# Patient Record
Sex: Male | Born: 1965 | Race: White | Hispanic: No | Marital: Married | Smoking: Never smoker
Health system: Southern US, Community
[De-identification: ages and names within clinical notes are randomized; demographics above are authoritative.]

## PROBLEM LIST (undated history)

## (undated) DIAGNOSIS — M1990 Unspecified osteoarthritis, unspecified site: Secondary | ICD-10-CM

## (undated) DIAGNOSIS — G709 Myoneural disorder, unspecified: Secondary | ICD-10-CM

## (undated) HISTORY — PX: CARDIAC CATHETERIZATION: SHX172

---

## 2005-04-03 ENCOUNTER — Ambulatory Visit: Payer: Self-pay | Admitting: Cardiology

## 2010-08-28 HISTORY — PX: ULNAR NERVE TRANSPOSITION: SHX2595

## 2016-12-15 ENCOUNTER — Other Ambulatory Visit: Payer: Self-pay | Admitting: Nurse Practitioner

## 2016-12-15 DIAGNOSIS — G243 Spasmodic torticollis: Secondary | ICD-10-CM

## 2017-01-02 ENCOUNTER — Ambulatory Visit
Admission: RE | Admit: 2017-01-02 | Discharge: 2017-01-02 | Disposition: A | Discharge status: Home / Self Care | Payer: BLUE CROSS/BLUE SHIELD | Source: Ambulatory Visit | Attending: Nurse Practitioner | Admitting: Nurse Practitioner

## 2017-01-02 DIAGNOSIS — G248 Other dystonia: Secondary | ICD-10-CM | POA: Diagnosis not present

## 2017-01-02 DIAGNOSIS — M50323 Other cervical disc degeneration at C6-C7 level: Secondary | ICD-10-CM | POA: Insufficient documentation

## 2017-01-02 DIAGNOSIS — M4802 Spinal stenosis, cervical region: Secondary | ICD-10-CM | POA: Diagnosis not present

## 2017-01-02 DIAGNOSIS — G243 Spasmodic torticollis: Secondary | ICD-10-CM

## 2017-03-07 ENCOUNTER — Ambulatory Visit: Payer: BLUE CROSS/BLUE SHIELD | Admitting: Anesthesiology

## 2017-03-07 ENCOUNTER — Ambulatory Visit
Admission: RE | Admit: 2017-03-07 | Discharge: 2017-03-07 | Disposition: A | Discharge status: Home / Self Care | Payer: BLUE CROSS/BLUE SHIELD | Source: Ambulatory Visit | Attending: Surgery | Admitting: Surgery

## 2017-03-07 ENCOUNTER — Encounter
Admission: RE | Disposition: A | Discharge status: Home / Self Care | Payer: Self-pay | Source: Ambulatory Visit | Attending: Surgery

## 2017-03-07 DIAGNOSIS — Z800 Family history of malignant neoplasm of digestive organs: Secondary | ICD-10-CM | POA: Insufficient documentation

## 2017-03-07 DIAGNOSIS — M1990 Unspecified osteoarthritis, unspecified site: Secondary | ICD-10-CM | POA: Insufficient documentation

## 2017-03-07 DIAGNOSIS — Z9889 Other specified postprocedural states: Secondary | ICD-10-CM | POA: Insufficient documentation

## 2017-03-07 DIAGNOSIS — M109 Gout, unspecified: Secondary | ICD-10-CM | POA: Insufficient documentation

## 2017-03-07 DIAGNOSIS — Z8249 Family history of ischemic heart disease and other diseases of the circulatory system: Secondary | ICD-10-CM | POA: Diagnosis not present

## 2017-03-07 DIAGNOSIS — Z86010 Personal history of colonic polyps: Secondary | ICD-10-CM | POA: Diagnosis not present

## 2017-03-07 DIAGNOSIS — Z79899 Other long term (current) drug therapy: Secondary | ICD-10-CM | POA: Insufficient documentation

## 2017-03-07 DIAGNOSIS — G5622 Lesion of ulnar nerve, left upper limb: Secondary | ICD-10-CM | POA: Diagnosis not present

## 2017-03-07 DIAGNOSIS — Z833 Family history of diabetes mellitus: Secondary | ICD-10-CM | POA: Insufficient documentation

## 2017-03-07 DIAGNOSIS — Z791 Long term (current) use of non-steroidal anti-inflammatories (NSAID): Secondary | ICD-10-CM | POA: Insufficient documentation

## 2017-03-07 HISTORY — PX: ULNAR NERVE TRANSPOSITION: SHX2595

## 2017-03-07 HISTORY — DX: Unspecified osteoarthritis, unspecified site: M19.90

## 2017-03-07 HISTORY — DX: Myoneural disorder, unspecified: G70.9

## 2017-03-07 SURGERY — ULNAR NERVE DECOMPRESSION/TRANSPOSITION
Anesthesia: Regional | Laterality: Left | Wound class: Clean

## 2017-03-07 MED ORDER — LACTATED RINGERS IV SOLN
INTRAVENOUS | Status: DC
Start: 2017-03-07 — End: 2017-03-07
  Administered 2017-03-07: 13:00:00 via INTRAVENOUS

## 2017-03-07 MED ORDER — ROPIVACAINE HCL 5 MG/ML IJ SOLN
INTRAMUSCULAR | Status: DC | PRN
  Administered 2017-03-07: 35 mL

## 2017-03-07 MED ORDER — ROPIVACAINE HCL 5 MG/ML IJ SOLN
INTRAMUSCULAR | Status: DC | PRN
  Administered 2017-03-07: 10 mL

## 2017-03-07 MED ORDER — ONDANSETRON HCL 4 MG/2ML IJ SOLN
INTRAMUSCULAR | Status: DC | PRN
  Administered 2017-03-07: 4 mg via INTRAVENOUS

## 2017-03-07 MED ORDER — PROPOFOL 10 MG/ML IV BOLUS
INTRAVENOUS | Status: DC | PRN
  Administered 2017-03-07: 200 mg via INTRAVENOUS

## 2017-03-07 MED ORDER — HYDROCODONE-ACETAMINOPHEN 5-325 MG PO TABS: 1.0000 | ORAL_TABLET | Freq: Four times a day (QID) | ORAL | 0 refills | Status: AC | PRN

## 2017-03-07 MED ORDER — DEXTROSE 5 % IV SOLN
2000.0000 mg | Freq: Once | INTRAVENOUS | Status: AC
  Administered 2017-03-07: 2000 mg via INTRAVENOUS

## 2017-03-07 MED ORDER — LACTATED RINGERS IV SOLN
INTRAVENOUS | Status: DC
  Administered 2017-03-07: 13:00:00 via INTRAVENOUS

## 2017-03-07 MED ORDER — MIDAZOLAM HCL 2 MG/2ML IJ SOLN
INTRAMUSCULAR | Status: DC | PRN
  Administered 2017-03-07: 2 mg via INTRAVENOUS

## 2017-03-07 MED ORDER — FENTANYL CITRATE (PF) 100 MCG/2ML IJ SOLN
INTRAMUSCULAR | Status: DC | PRN
  Administered 2017-03-07: 50 ug via INTRAVENOUS

## 2017-03-07 MED ORDER — DEXAMETHASONE SODIUM PHOSPHATE 4 MG/ML IJ SOLN
INTRAMUSCULAR | Status: DC | PRN
  Administered 2017-03-07: 4 mg via INTRAVENOUS

## 2017-03-07 MED ORDER — LIDOCAINE HCL (CARDIAC) 20 MG/ML IV SOLN
INTRAVENOUS | Status: DC | PRN
  Administered 2017-03-07: 50 mg via INTRATRACHEAL

## 2017-03-07 SURGICAL SUPPLY — 32 items
ADHESIVE MASTISOL STRL (MISCELLANEOUS) ×2 IMPLANT
BANDAGE ELASTIC 3 LF NS (GAUZE/BANDAGES/DRESSINGS) ×2 IMPLANT
BANDAGE ELASTIC 4 LF NS (GAUZE/BANDAGES/DRESSINGS) ×2 IMPLANT
BNDG COHESIVE 4X5 TAN STRL (GAUZE/BANDAGES/DRESSINGS) ×2 IMPLANT
BNDG ESMARK 4X12 TAN STRL LF (GAUZE/BANDAGES/DRESSINGS) ×2 IMPLANT
CANISTER SUCT 1200ML W/VALVE (MISCELLANEOUS) ×2 IMPLANT
CHLORAPREP W/TINT 26ML (MISCELLANEOUS) ×2 IMPLANT
CORD BIP STRL DISP 12FT (MISCELLANEOUS) ×2 IMPLANT
COVER LIGHT HANDLE UNIVERSAL (MISCELLANEOUS) ×4 IMPLANT
DRAPE U-SHAPE 48X52 POLY STRL (PACKS) ×2 IMPLANT
GAUZE PETRO XEROFOAM 1X8 (MISCELLANEOUS) ×2 IMPLANT
GAUZE SPONGE 4X4 12PLY STRL (GAUZE/BANDAGES/DRESSINGS) ×2 IMPLANT
GLOVE BIO SURGEON STRL SZ7.5 (GLOVE) ×2 IMPLANT
GLOVE INDICATOR 8.0 STRL GRN (GLOVE) ×2 IMPLANT
GOWN STRL REUS W/ TWL LRG LVL3 (GOWN DISPOSABLE) ×1 IMPLANT
GOWN STRL REUS W/ TWL XL LVL3 (GOWN DISPOSABLE) ×1 IMPLANT
GOWN STRL REUS W/TWL LRG LVL3 (GOWN DISPOSABLE) ×1
GOWN STRL REUS W/TWL XL LVL3 (GOWN DISPOSABLE) ×1
KIT ROOM TURNOVER OR (KITS) ×2 IMPLANT
LOOP VESSEL RED MINI 1.3X0.9 (MISCELLANEOUS) ×1 IMPLANT
LOOPS RED MINI 1.3MMX0.9MM (MISCELLANEOUS) ×1
NS IRRIG 500ML POUR BTL (IV SOLUTION) ×2 IMPLANT
PACK EXTREMITY ARMC (MISCELLANEOUS) ×2 IMPLANT
PAD GROUND ADULT SPLIT (MISCELLANEOUS) ×2 IMPLANT
SLING ARM LRG DEEP (SOFTGOODS) ×2 IMPLANT
STOCKINETTE IMPERVIOUS LG (DRAPES) ×2 IMPLANT
STRAP BODY AND KNEE 60X3 (MISCELLANEOUS) ×2 IMPLANT
STRIP CLOSURE SKIN 1/4X4 (GAUZE/BANDAGES/DRESSINGS) ×2 IMPLANT
SUT VIC AB 2-0 CT1 27 (SUTURE) ×1
SUT VIC AB 2-0 CT1 TAPERPNT 27 (SUTURE) ×1 IMPLANT
SUT VIC AB 3-0 SH 27 (SUTURE) ×1
SUT VIC AB 3-0 SH 27X BRD (SUTURE) ×1 IMPLANT

## 2017-03-07 NOTE — Transfer of Care (Signed)
Immediate Anesthesia Transfer of Care Note  Patient: Dustin Ward  Procedure(s) Performed: Procedure(s): ULNAR NERVE DECOMPRESSION/TRANSPOSITION  elbow (Left)  Patient Location: PACU  Anesthesia Type: General, Regional  Level of Consciousness: awake, alert  and patient cooperative  Airway and Oxygen Therapy: Patient Spontanous Breathing and Patient connected to supplemental oxygen  Post-op Assessment: Post-op Vital signs reviewed, Patient's Cardiovascular Status Stable, Respiratory Function Stable, Patent Airway and No signs of Nausea or vomiting  Post-op Vital Signs: Reviewed and stable  Complications: No apparent anesthesia complications

## 2017-03-07 NOTE — Anesthesia Preprocedure Evaluation (Signed)
Anesthesia Evaluation  Patient identified by MRN, date of birth, ID band Patient awake    Reviewed: Allergy & Precautions, NPO status , Patient's Chart, lab work & pertinent test results  History of Anesthesia Complications Negative for: history of anesthetic complications  Airway Mallampati: III  TM Distance: >3 FB Neck ROM: Full    Dental no notable dental hx.    Pulmonary neg pulmonary ROS,    Pulmonary exam normal breath sounds clear to auscultation       Cardiovascular Exercise Tolerance: Good negative cardio ROS Normal cardiovascular exam Rhythm:Regular Rate:Normal     Neuro/Psych negative neurological ROS  negative psych ROS   GI/Hepatic negative GI ROS, Neg liver ROS,   Endo/Other  negative endocrine ROS  Renal/GU negative Renal ROS     Musculoskeletal  (+) Arthritis , Osteoarthritis,    Abdominal   Peds  Hematology negative hematology ROS (+)   Anesthesia Other Findings   Reproductive/Obstetrics                             Anesthesia Physical Anesthesia Plan  ASA: I  Anesthesia Plan: General and Regional   Post-op Pain Management: GA combined w/ Regional for post-op pain   Induction: Intravenous  PONV Risk Score and Plan: 1 and Ondansetron  Airway Management Planned: LMA  Additional Equipment:   Intra-op Plan:   Post-operative Plan: Extubation in OR  Informed Consent: I have reviewed the patients History and Physical, chart, labs and discussed the procedure including the risks, benefits and alternatives for the proposed anesthesia with the patient or authorized representative who has indicated his/her understanding and acceptance.     Plan Discussed with:   Anesthesia Plan Comments: (Plan for supraclavicular nerve block for post-operative pain control.)        Anesthesia Quick Evaluation

## 2017-03-07 NOTE — Anesthesia Postprocedure Evaluation (Signed)
Anesthesia Post Note  Patient: [name]  Procedure(s) Performed: Procedure(s) (LRB): ULNAR NERVE DECOMPRESSION/TRANSPOSITION  elbow (Left)  Patient location during evaluation: PACU Anesthesia Type: Regional Level of consciousness: awake and alert, oriented and patient cooperative Pain management: pain level controlled Vital Signs Assessment: post-procedure vital signs reviewed and stable Respiratory status: spontaneous breathing, nonlabored ventilation and respiratory function stable Cardiovascular status: blood pressure returned to baseline and stable Postop Assessment: adequate PO intake Anesthetic complications: no    Reed BreechAndrea Mally Gavina

## 2017-03-07 NOTE — Op Note (Signed)
03/07/2017  3:44 PM  Patient:   Dustin Ward  Pre-Op Diagnosis:   Left cubital tunnel syndrome.  Post-Op Diagnosis:   Same  Procedure:   Subcutaneous anterior transposition ulnar nerve, left elbow.  Surgeon:   Maryagnes AmosJ. Jeffrey Poggi, MD  Assistant:   None  Anesthesia:   General LMA  Findings:   As above.  Complications:   None  EBL:   5 cc  Fluids:   900 cc crystalloid  TT:   61 minutes at 250 mmHg  Drains:   None  Closure:   Staples  Brief Clinical Note:   The patient is a 51 year old male with a history of pian and paresthesias to the left ring and little fingers. The patient's history and examination were consistent with cubital tunnel syndrome. The patient presents at this time for subcutaneous anterior transposition of the ulnar nerve at the left elbow.  Procedure:   The patient was brought into the operating room and lain in the supine position. After adequate general laryngal mask anesthesia was obtained, the patient's left upper extremity was prepped with ChloraPrep solution before being draped sterilely. Preoperative antibiotics were administered. After performing a timeout to verify the appropriate surgical site, the limb was exsanguinated with an Esmarch and the tourniquet inflated to 250 mmHg. An approximately 7-8 cm curvilinear incision was made along the course of the ulnar nerve posterior to the medial epicondyle. The incision was carried down through the subcutaneous tissues with care taken to avoid the small branches of the medial antebrachial nerve to expose the sheath overlying the cubital tunnel. The ulnar nerve was identified at the proximal end of the tunnel and was dissected free. The nerve was then carefully followed as the roof of the cubital tunnel was released from proximal to distal. Distally, the fascia overlying the pronator muscle was released for several centimeters. The nerve was clearly thickened just proximal to the pronator fascia, indicating the most  likely source of the nerve compression. A vessel loop was passed around the nerve and used to provide gentle traction on the nerve while circumferential dissection was carried out under loupe magnification using bipolar electrocautery and tenotomy scissors.   Once the nerve was fully mobilized, the anterior tissues were elevated as a flap just superficial to the fascia overlying the flexor wad and a pocket created to accept the nerve. Care was taken to be sure that there was no undue tension along the nerve either proximally or distally. The nerve was carefully retracted while several 2-0 Vicryl interrupted sutures were placed to reapproximate the flap to the medial epicondylar soft tissues, thereby creating a "sling" for the ulnar nerve. The cubital tunnel itself was reapproximated using several 2-0 Vicryl interrupted sutures in order to prevent the nerve from falling back into the cubital tunnel.   The wound was copiously irrigated with sterile saline solution before the subcutaneous tissues were closed using 2-0 Vicryl interrupted sutures. The subcuticular layer was re-approximated using 3-0 Vicryl inverted subcuticular interrupted sutures before Benzoin and steri-strips were applied to the skin. A total of 10 cc of 0.5% plain Sensorcaine was injected in and around the incision to help with postoperative analgesia. A sterile bulky dressing was applied to the arm before the patient was placed into a sling. The patient was then awakened, extubated, and returned to the recovery room in satisfactory condition after tolerating the procedure well.

## 2017-03-07 NOTE — H&P (Signed)
Paper H&P to be scanned into permanent record. H&P reviewed and patient re-examined. No changes. 

## 2017-03-07 NOTE — Anesthesia Procedure Notes (Signed)
Anesthesia Regional Block: Supraclavicular block   Pre-Anesthetic Checklist: ,, timeout performed, Correct Patient, Correct Site, Correct Laterality, Correct Procedure, Correct Position, site marked, Risks and benefits discussed,  Surgical consent,  Pre-op evaluation,  At surgeon's request and post-op pain management  Laterality: Left  Prep: chloraprep       Needles:  Injection technique: Single-shot  Needle Type: Stimiplex     Needle Length: 5cm  Needle Gauge: 21     Additional Needles:   Procedures: ultrasound guided,,,,,,,,  Narrative:  Start time: 03/07/2017 1:13 PM End time: 03/07/2017 1:25 PM Injection made incrementally with aspirations every 5 mL.  Performed by: Personally  Anesthesiologist: Dustin Ward, Dustin Ward  Additional Notes: Procedure well-tolerated without any apparent complications.

## 2017-03-07 NOTE — Progress Notes (Signed)
Assisted Dr. Ronni RumbleMazzoni with left, ultrasound guided, supraclavicular block. Side rails up, monitors on throughout procedure. See vital signs in flow sheet. Tolerated Procedure well.

## 2017-03-07 NOTE — Anesthesia Procedure Notes (Signed)
Procedure Name: LMA Insertion Date/Time: 03/07/2017 2:14 PM Performed by: Andee PolesBUSH, Dustin Ward Pre-anesthesia Checklist: Patient identified, Emergency Drugs available, Suction available, Timeout performed and Patient being monitored Patient Re-evaluated:Patient Re-evaluated prior to induction Oxygen Delivery Method: Circle system utilized Preoxygenation: Pre-oxygenation with 100% oxygen Induction Type: IV induction LMA: LMA inserted LMA Size: 4.0 Number of attempts: 1 Placement Confirmation: positive ETCO2 and breath sounds checked- equal and bilateral Tube secured with: Tape

## 2017-03-07 NOTE — Discharge Instructions (Signed)
General Anesthesia, Adult, Care After These instructions provide you with information about caring for yourself after your procedure. Your health care provider may also give you more specific instructions. Your treatment has been planned according to current medical practices, but problems sometimes occur. Call your health care provider if you have any problems or questions after your procedure. What can I expect after the procedure? After the procedure, it is common to have:  Vomiting.  A sore throat.  Mental slowness.  It is common to feel:  Nauseous.  Cold or shivery.  Sleepy.  Tired.  Sore or achy, even in parts of your body where you did not have surgery.  Follow these instructions at home: For at least 24 hours after the procedure:  Do not: ? Participate in activities where you could fall or become injured. ? Drive. ? Use heavy machinery. ? Drink alcohol. ? Take sleeping pills or medicines that cause drowsiness. ? Make important decisions or sign legal documents. ? Take care of children on your own.  Rest. Eating and drinking  If you vomit, drink water, juice, or soup when you can drink without vomiting.  Drink enough fluid to keep your urine clear or pale yellow.  Make sure you have little or no nausea before eating solid foods.  Follow the diet recommended by your health care provider. General instructions  Have a responsible adult stay with you until you are awake and alert.  Return to your normal activities as told by your health care provider. Ask your health care provider what activities are safe for you.  Take over-the-counter and prescription medicines only as told by your health care provider.  If you smoke, do not smoke without supervision.  Keep all follow-up visits as told by your health care provider. This is important. Contact a health care provider if:  You continue to have nausea or vomiting at home, and medicines are not helpful.  You  cannot drink fluids or start eating again.  You cannot urinate after 8-12 hours.  You develop a skin rash.  You have fever.  You have increasing redness at the site of your procedure. Get help right away if:  You have difficulty breathing.  You have chest pain.  You have unexpected bleeding.  You feel that you are having a life-threatening or urgent problem. This information is not intended to replace advice given to you by your health care provider. Make sure you discuss any questions you have with your health care provider. Document Released: 11/20/2000 Document Revised: 01/17/2016 Document Reviewed: 07/29/2015 Elsevier Interactive Patient Education  2018 [employer]  Keep dressing dry and intact. Keep hand elevated above heart level. May shower after dressing removed on postop day 4 (Sunday). Cover staples with Band-Aids after drying off. Apply ice to affected area frequently. Resume meloxicam daily. Take pain medication as prescribed or extra strength Tylenol when needed.  Return for follow-up in 10-14 days or as scheduled.

## 2017-03-08 ENCOUNTER — Encounter: Payer: Self-pay | Admitting: Surgery

## 2018-04-12 IMAGING — MR MR CERVICAL SPINE W/O CM
5 series · 33 of 48 positions shown · non-contrast
Comparison: None.

CLINICAL DATA: Cervical dystonia

EXAM:
MRI CERVICAL SPINE WITHOUT CONTRAST
TECHNIQUE: Multiplanar, multisequence MR imaging of the cervical spine was
performed. No intravenous contrast was administered.

[Series 2: T2 · sagittal · 3.0mm · 0.56mm/px · 6 of 13 slices shown (1 of 2)]
[im 1/13]
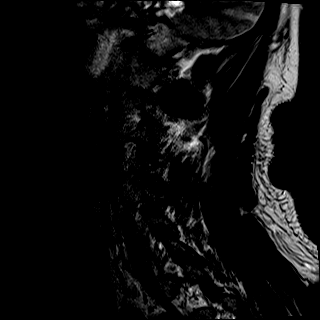
[im 3/13]
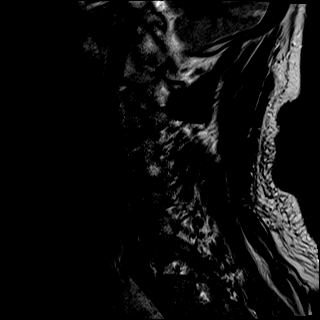
[im 5/13]
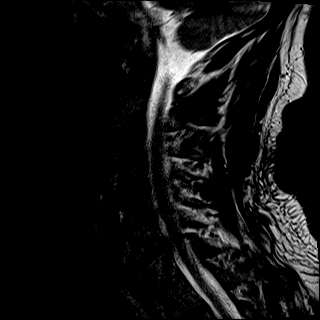
[im 8/13]
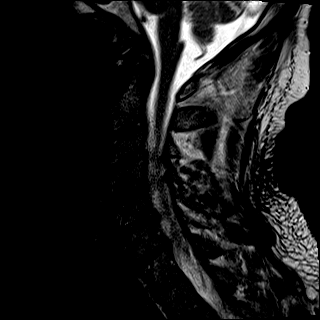
[im 10/13]
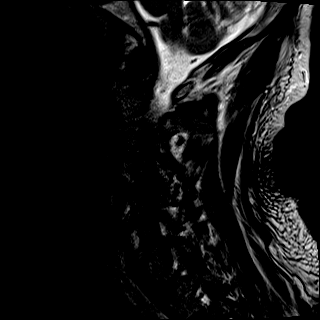
[im 13/13]
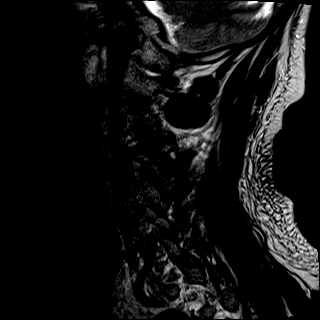

[Series 3: T1 · sagittal · 3.0mm · 0.70mm/px · 7 of 13 slices shown]
[im 1/13]
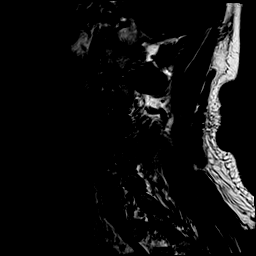
[im 3/13]
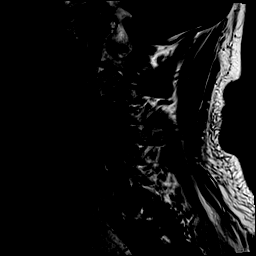
[im 5/13]
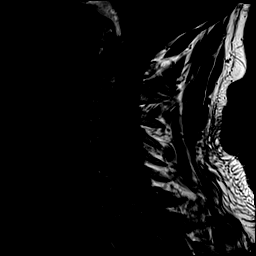
[im 7/13]
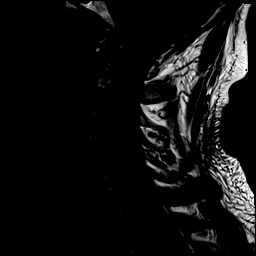
[im 9/13]
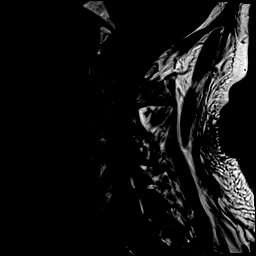
[im 11/13]
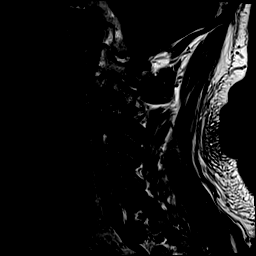
[im 13/13]
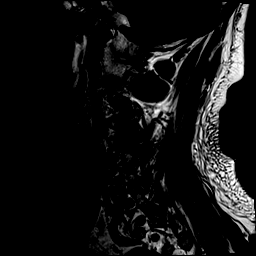

[Series 4: STIR · sagittal · 3.0mm · 0.35mm/px · 7 of 13 slices shown]
[im 1/13]
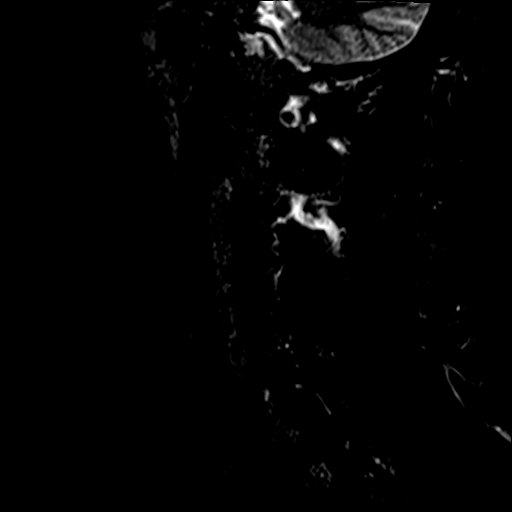
[im 3/13]
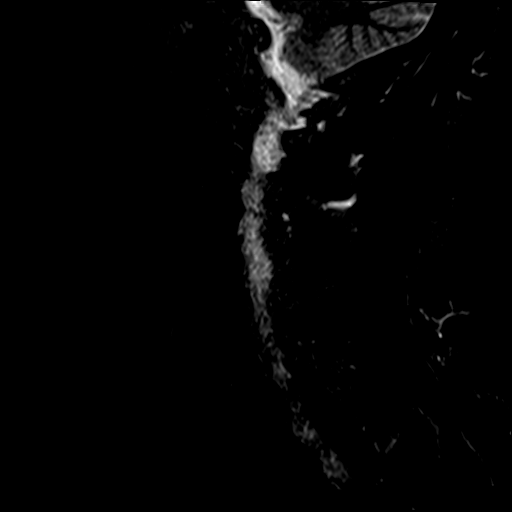
[im 5/13]
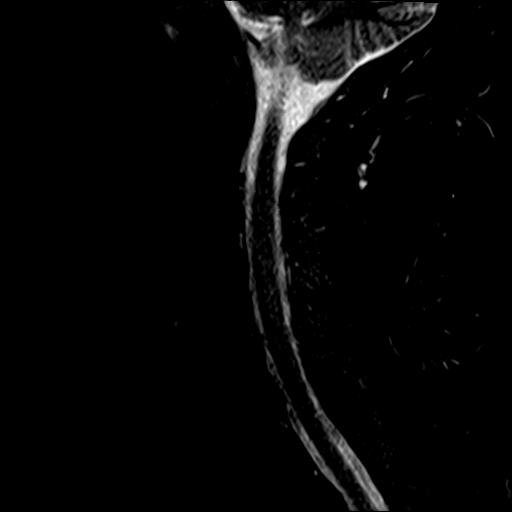
[im 7/13]
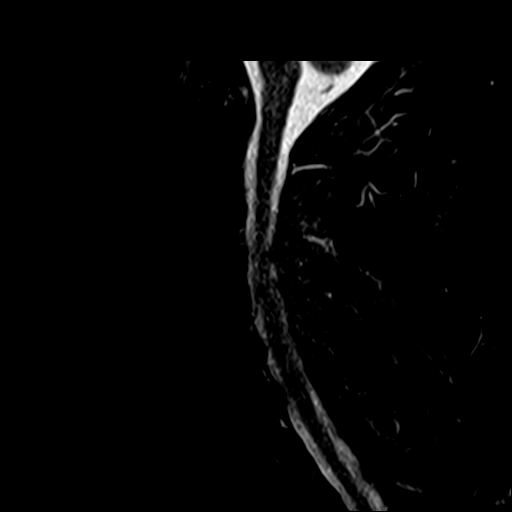
[im 9/13]
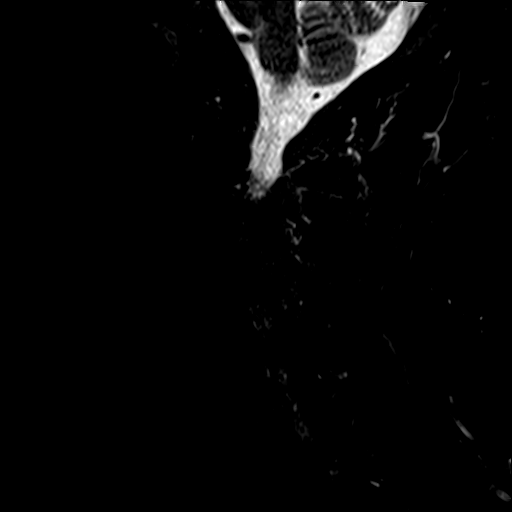
[im 11/13]
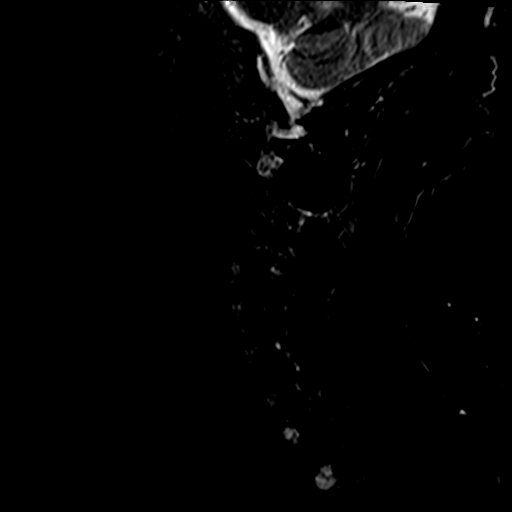
[im 13/13]
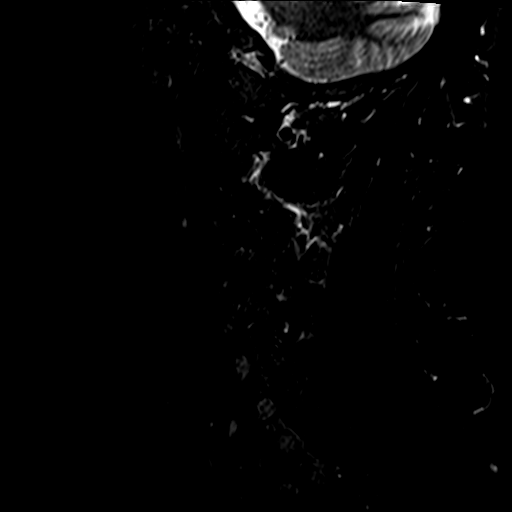

[Series 6: mpgr ax · axial · 3.0mm · 0.35mm/px · z∈[-98,-48]mm · 5 of 28 slices shown]
[im 1/28]
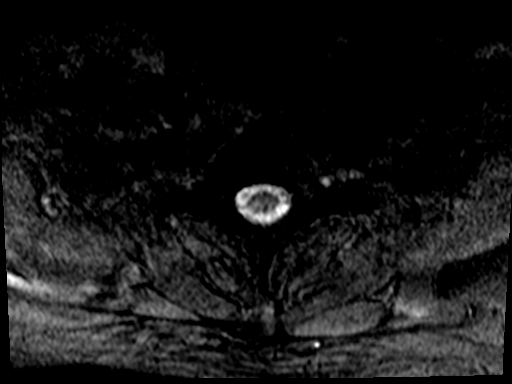
[im 5/28]
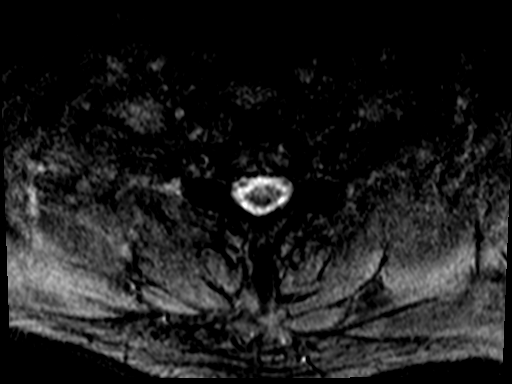
[im 9/28]
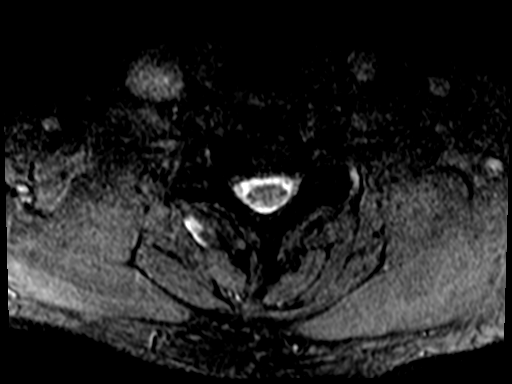
[im 13/28]
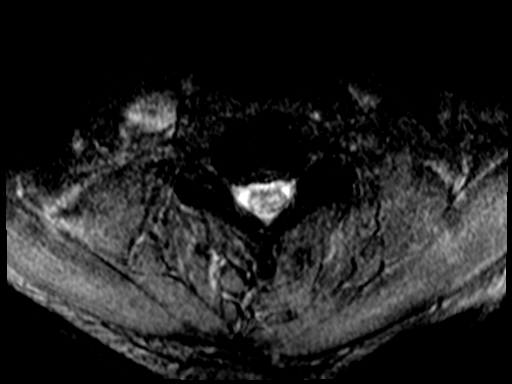
[im 15/28]
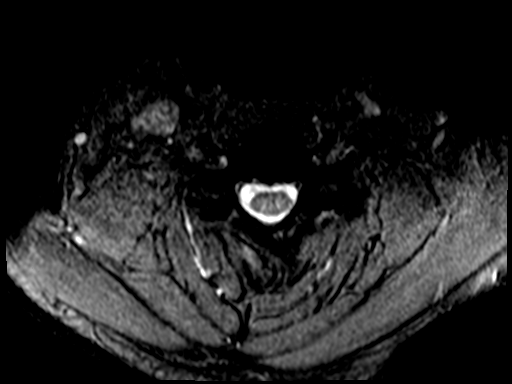

[Series 100: T2 · axial · 3.0mm · 0.62mm/px · z∈[-111,-14]mm · 8 of 28 slices shown (2 of 2)]
[im 1/28]
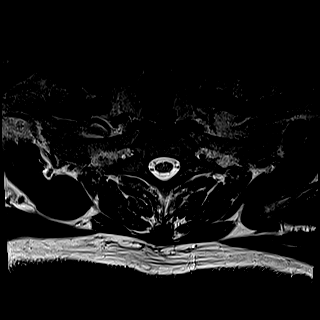
[im 5/28]
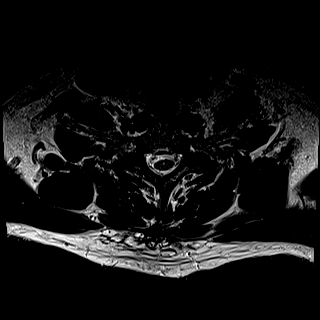
[im 9/28]
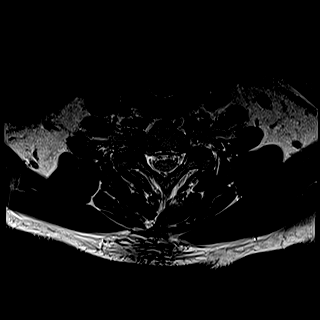
[im 13/28]
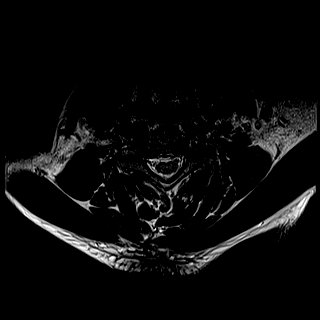
[im 15/28]
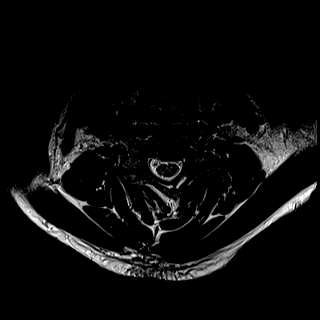
[im 19/28]
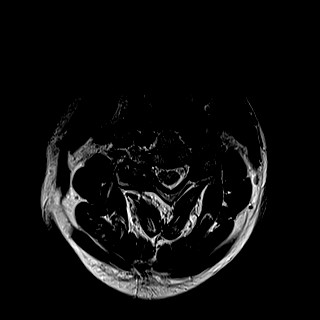
[im 23/28]
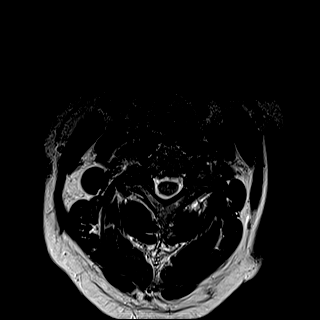
[im 28/28]
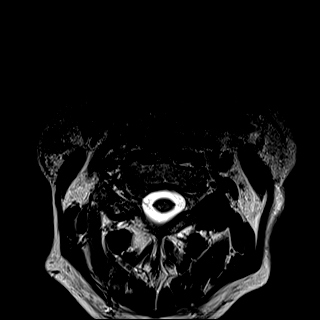

[33 of 48 positions shown; findings below may reference images not displayed]

FINDINGS: Alignment: Normal alignment.  Image quality degraded by motion

Vertebrae: Negative for fracture or mass.

Cord: Normal signal and morphology

Posterior Fossa, vertebral arteries, paraspinal tissues: Negative

Disc levels:

C2-3: Mild disc and facet degeneration. Moderate right foraminal
narrowing due to spurring

C3-4: Mild disc degeneration. Advanced facet degeneration on the
right with bony overgrowth causing severe right foraminal
encroachment. Spinal canal adequate in size.

C4-5: Moderate facet degeneration bilaterally. Moderate left
foraminal encroachment and mild right foraminal encroachment. Spinal
canal adequate in size

C5-6: Mild disc degeneration and uncinate spurring diffusely.
Bilateral facet hypertrophy. Moderate foraminal encroachment
bilaterally with mild spinal stenosis

C6-7: Moderate disc degeneration and spondylosis with diffuse
uncinate spurring. Mild spinal stenosis and moderate foraminal
encroachment bilaterally

C7-T1:  Negative
IMPRESSION: Moderate right foraminal narrowing at C2-3 due to spurring.

Severe right foraminal narrowing at C3-4 due to spurring

Moderate left foraminal encroachment and mild right foraminal
encroachment at C4-5 due to spurring

Mild spinal stenosis with moderate foraminal encroachment
bilaterally at C5-6 and C6-7.
